# Patient Record
Sex: Female | Born: 1945 | Hispanic: Yes | Marital: Single
Health system: Midwestern US, Academic
[De-identification: ages and names within clinical notes are randomized; demographics above are authoritative.]

---

## 2018-07-31 ENCOUNTER — Emergency Department: Admit: 2018-07-31 | Discharge: 2018-07-31

## 2018-07-31 ENCOUNTER — Encounter: Admit: 2018-07-31 | Discharge: 2018-07-31

## 2018-07-31 ENCOUNTER — Emergency Department: Admit: 2018-07-31 | Discharge: 2018-07-31 | Disposition: A | Discharge status: Home / Self Care | Payer: MEDICARE

## 2018-07-31 DIAGNOSIS — R8271 Bacteriuria: Principal | ICD-10-CM

## 2018-07-31 DIAGNOSIS — E785 Hyperlipidemia, unspecified: Principal | ICD-10-CM

## 2018-07-31 DIAGNOSIS — I10 Essential (primary) hypertension: ICD-10-CM

## 2018-07-31 LAB — URINALYSIS DIPSTICK
Lab: NEGATIVE % (ref 0–2)
Lab: NEGATIVE % (ref 0–5)
Lab: NEGATIVE % (ref 4–12)
Lab: NEGATIVE % — ABNORMAL LOW (ref 11–15)
Lab: NEGATIVE % — ABNORMAL LOW (ref 41–77)
Lab: NEGATIVE 10*3/uL (ref 150–400)
Lab: NEGATIVE FL — ABNORMAL LOW (ref 7–11)

## 2018-07-31 LAB — CBC AND DIFF
Lab: 0.1 10*3/uL (ref 0–0.20)
Lab: 0.1 10*3/uL (ref 0–0.45)
Lab: 0.6 10*3/uL (ref 0–0.80)
Lab: 2.3 10*3/uL (ref 1.0–4.8)
Lab: 3.6 10*3/uL (ref 1.8–7.0)
Lab: 6.8 K/UL — ABNORMAL LOW (ref 4.5–11.0)

## 2018-07-31 LAB — URINALYSIS, MICROSCOPIC

## 2018-07-31 LAB — C REACTIVE PROTEIN (CRP): Lab: 0.1 mg/dL — ABNORMAL HIGH (ref <1.0)

## 2018-07-31 LAB — COMPREHENSIVE METABOLIC PANEL
Lab: 138 MMOL/L — ABNORMAL LOW (ref 137–147)
Lab: 3.9 MMOL/L — ABNORMAL HIGH (ref 3.5–5.1)

## 2018-07-31 MED ORDER — FENTANYL CITRATE (PF) 50 MCG/ML IJ SOLN
50 ug | Freq: Once | INTRAVENOUS | 0 refills | Status: CP
Start: 2018-07-31 — End: ?
  Administered 2018-07-31: 18:00:00 50 ug via INTRAVENOUS

## 2018-07-31 MED ORDER — ONDANSETRON HCL (PF) 4 MG/2 ML IJ SOLN
4 mg | Freq: Once | INTRAVENOUS | 0 refills | Status: CP
Start: 2018-07-31 — End: ?
  Administered 2018-07-31: 18:00:00 4 mg via INTRAVENOUS

## 2018-07-31 MED ORDER — CEPHALEXIN 500 MG PO CAP
500 mg | ORAL_CAPSULE | Freq: Two times a day (BID) | ORAL | 0 refills | Status: AC
Start: 2018-07-31 — End: ?

## 2018-07-31 MED ORDER — IOHEXOL 350 MG IODINE/ML IV SOLN
75 mL | Freq: Once | INTRAVENOUS | 0 refills | Status: CP
Start: 2018-07-31 — End: ?
  Administered 2018-07-31: 20:00:00 75 mL via INTRAVENOUS

## 2018-07-31 MED ORDER — SODIUM CHLORIDE 0.9 % IJ SOLN
50 mL | Freq: Once | INTRAVENOUS | 0 refills | Status: CP
Start: 2018-07-31 — End: ?

## 2018-07-31 MED ORDER — PHENAZOPYRIDINE 100 MG PO TAB
100 mg | ORAL_TABLET | Freq: Three times a day (TID) | ORAL | 0 refills | Status: AC | PRN
Start: 2018-07-31 — End: ?

## 2018-08-02 LAB — CULTURE-URINE W/SENSITIVITY: Lab: <10

## 2020-03-23 ENCOUNTER — Ambulatory Visit: Admit: 2020-03-23 | Discharge: 2020-03-24 | Payer: MEDICARE

## 2020-03-23 ENCOUNTER — Encounter: Admit: 2020-03-23 | Discharge: 2020-03-23 | Payer: MEDICARE

## 2020-03-23 NOTE — Progress Notes
Date of Service: 03/23/2020    Subjective:             Sandy Castro is a 74 y.o. female with a history of HTN, HLD, GERD who presents for evaluation of fecal incontinence and abdominal pain.    History of Present Illness    The patient is Spanish speaker. She is accompanied by her daughter who acts as interpreter per patient's request. She reports having symptoms of fecal incontinence. Normally, she has a bowel movement every 2 days. Prior to having a bowel movement, she will often have generalized cramping abdominal pain. She will have fecal urgency and often is not able to make it to the restroom in time. She will then have incontinence of loose stool. She feels that she is passing stool, no numbness. She reports when the stool is more formed, she is usually able to make it to the restroom on time. She denies any blood in the stool. She reports at times she will have constipation with straining. Has used Miralax and stool softeners on occasion but does not use any thing regularly. She denies tenesmus or nocturnal stools. No difficulties with urination or urinary incontinence. She reports prior hysterectomy, no other abdominal surgeries. She has 5 kids, all vaginal deliveries.        Review of Systems   Constitutional: Positive for fatigue.   HENT: Negative.    Eyes: Negative.    Respiratory: Positive for apnea.    Cardiovascular: Positive for leg swelling.   Gastrointestinal: Positive for constipation.   Endocrine: Negative.    Genitourinary: Positive for enuresis and pelvic pain.   Musculoskeletal: Positive for arthralgias, back pain, myalgias, neck pain and neck stiffness.   Skin: Negative.    Allergic/Immunologic: Negative.    Neurological: Negative.    Hematological: Negative.    Psychiatric/Behavioral: Negative.      Medical History:   Diagnosis Date   ? Hyperlipidemia    ? Hypertension      Surgical History:   Procedure Laterality Date   ? HX HYSTERECTOMY       History reviewed. No pertinent family history. Social History     Socioeconomic History   ? Marital status: Married     Spouse name: Not on file   ? Number of children: Not on file   ? Years of education: Not on file   ? Highest education level: Not on file   Occupational History   ? Not on file   Tobacco Use   ? Smoking status: Never Smoker   ? Smokeless tobacco: Never Used   Substance and Sexual Activity   ? Alcohol use: No   ? Drug use: No   ? Sexual activity: Not on file   Other Topics Concern   ? Not on file   Social History Narrative   ? Not on file         Objective:         ? AMLODIPINE BESYLATE (NORVASC PO) Take 5 mg by mouth.   ? CARVEDILOL PO Take 25 mg by mouth.   ? chlorthalidone (HYGROTON) 50 mg tablet Take 50 mg by mouth daily.   ? ESTRADIOL (ESTRACE PO) Take  by mouth.   ? hydrALAZINE (APRESOLINE) 100 mg tablet    ? HYDROCHLOROTHIAZIDE PO Take  by mouth.   ? hydrOXYzine (VISTARIL) 25 mg capsule Take 1 Cap by mouth every 6 hours as needed for Itching.   ? LISINOPRIL PO Take  by mouth.   ?  losartan (COZAAR) 100 mg tablet Take 100 mg by mouth daily.   ? naproxen (NAPROSYN) 500 mg tablet    ? OMEPRAZOLE PO Take  by mouth.   ? pantoprazole DR (PROTONIX) 40 mg tablet Take 40 mg by mouth daily.   ? phenazopyridine (PYRIDIUM) 100 mg tablet Take one tablet by mouth three times daily as needed for Pain. Take after meals for up to 2 days.   ? SIMVASTATIN PO Take  by mouth.   ? spironolactone (ALDACTONE) 25 mg tablet Take 25 mg by mouth daily.   ? traZODone (DESYREL) 100 mg tablet      Vitals:    03/23/20 1352   BP: 129/44   BP Source: Arm, Left Upper   Patient Position: Sitting   Pulse: 57   Temp: 36.8 ?C (98.2 ?F)   TempSrc: Oral   Weight: 81.6 kg (180 lb)   Height: 162.6 cm (64)   PainSc: Zero     Body mass index is 30.9 kg/m?[facility]     Physical Exam  Vitals signs reviewed.   Constitutional:       Appearance: She is obese.   HENT:      Mouth/Throat:      Mouth: Mucous membranes are moist.      Pharynx: Oropharynx is clear.   Eyes:      General: No scleral icterus.     Extraocular Movements: Extraocular movements intact.      Pupils: Pupils are equal, round, and reactive to light.   Cardiovascular:      Rate and Rhythm: Normal rate and regular rhythm.      Pulses: Normal pulses.   Pulmonary:      Effort: Pulmonary effort is normal.   Abdominal:      General: Abdomen is flat. There is no distension.      Palpations: Abdomen is soft. There is no mass.      Tenderness: There is no abdominal tenderness. There is no guarding.   Genitourinary:     Comments: Rectal exam: Perianal sensation intact. No rectal tenderness or mass. Rectal tone seems mildly reduced but squeeze present   Neurological:      General: No focal deficit present.      Mental Status: She is alert and oriented to person, place, and time.              Assessment and Plan:  Sandy Castro is a 74 y.o. female with a history of HTN, HLD, GERD who presents for evaluation of fecal incontinence and abdominal pain.      #Fecal Incontinence  #Alternating diarrhea and constipation  #Abdominal pain  -Constipation alternating with diarrhea. 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 Per outside records, this was performed at [facility]  -Recommend starting fiber supplement daily to bulk stools to reduce diarrhea/incontinence and promote regularity  -Anorectal manometry to evaluate sphincter tone, sensation and propulsive force.  -We will obtain her outside colonoscopy records for review.    RTC 11M    Discussed with Dr. Dolores Hoose, MD  GI Fellow  Pager [phone]

## 2020-03-24 ENCOUNTER — Ambulatory Visit: Admit: 2020-03-24 | Discharge: 2020-03-24 | Payer: MEDICARE

## 2020-03-24 ENCOUNTER — Encounter: Admit: 2020-03-24 | Discharge: 2020-03-24 | Payer: MEDICARE

## 2020-03-24 DIAGNOSIS — R159 Full incontinence of feces: Secondary | ICD-10-CM

## 2020-03-27 ENCOUNTER — Encounter: Admit: 2020-03-27 | Discharge: 2020-03-27 | Payer: MEDICARE

## 2020-03-27 DIAGNOSIS — E785 Hyperlipidemia, unspecified: Secondary | ICD-10-CM

## 2020-03-27 DIAGNOSIS — I10 Essential (primary) hypertension: Secondary | ICD-10-CM

## 2020-05-15 ENCOUNTER — Encounter: Admit: 2020-05-15 | Discharge: 2020-05-15 | Payer: MEDICARE

## 2020-05-15 NOTE — Telephone Encounter
Drenda Freeze requesting to est care for pt. d/t painful, hard breast. States 6 months ago the mammogram was clear. Please call [phone]. States pt. will need a spanish interpreter. LVM [id number]

## 2020-05-15 NOTE — Telephone Encounter
Patient has been scheduled

## 2020-08-30 ENCOUNTER — Emergency Department: Admit: 2020-08-30 | Discharge: 2020-08-30 | Payer: MEDICARE

## 2020-08-30 ENCOUNTER — Encounter: Admit: 2020-08-30 | Discharge: 2020-08-30 | Payer: MEDICARE

## 2020-08-30 DIAGNOSIS — I10 Essential (primary) hypertension: Secondary | ICD-10-CM

## 2020-08-30 DIAGNOSIS — E785 Hyperlipidemia, unspecified: Secondary | ICD-10-CM

## 2020-08-30 DIAGNOSIS — R059 Cough: Secondary | ICD-10-CM

## 2020-08-30 DIAGNOSIS — R079 Chest pain, unspecified: Secondary | ICD-10-CM

## 2020-08-30 LAB — COMPREHENSIVE METABOLIC PANEL
Lab: 0.4 mg/dL (ref 0.3–1.2)
Lab: 0.9 mg/dL (ref 0.4–1.00)
Lab: 10 K/UL (ref 3–12)
Lab: 102 MMOL/L (ref 98–110)
Lab: 126 mg/dL — ABNORMAL HIGH (ref 70–100)
Lab: 137 MMOL/L (ref 137–147)
Lab: 14 U/L (ref 7–56)
Lab: 16 U/L (ref 7–40)
Lab: 23 mg/dL (ref 7–25)
Lab: 25 MMOL/L (ref 21–30)
Lab: 3.5 MMOL/L (ref 3.5–5.1)
Lab: 37 U/L — ABNORMAL LOW (ref 25–110)
Lab: 4.1 g/dL (ref 3.5–5.0)
Lab: 7.3 g/dL (ref 6.0–8.0)
Lab: 9.1 mg/dL (ref 8.5–10.6)
Lab: >60 mL/min (ref >60)
Lab: >60 mL/min (ref >60)

## 2020-08-30 LAB — CBC AND DIFF
Lab: 0 10*3/uL (ref 0–0.20)
Lab: 0 10*3/uL (ref 0–0.45)
Lab: 6.6 10*3/uL (ref 4.5–11.0)
Lab: 15 (ref <20.7)

## 2020-08-30 LAB — POC TROPONIN
Lab: 0 ng/mL (ref 0.00–0.05)
Lab: 0 ng/mL (ref 0.00–0.05)

## 2020-08-30 LAB — BNP POC ER: Lab: 62 pg/mL (ref 0–100)

## 2020-08-30 LAB — COVID-19 (SARS-COV-2) PCR

## 2020-08-30 MED ORDER — AMOXICILLIN-POT CLAVULANATE 875-125 MG PO TAB
1 | ORAL_TABLET | Freq: Two times a day (BID) | ORAL | 0 refills | 7.00000 days | Status: DC
Start: 2020-08-30 — End: 2020-08-30

## 2020-08-30 MED ORDER — DOXYCYCLINE HYCLATE 100 MG PO TAB
100 mg | ORAL_TABLET | Freq: Two times a day (BID) | ORAL | 0 refills | 8.00000 days | Status: DC
Start: 2020-08-30 — End: 2020-08-30

## 2020-08-30 MED ORDER — DOXYCYCLINE HYCLATE 100 MG PO TAB
100 mg | Freq: Two times a day (BID) | ORAL | 0 refills | Status: DC
Start: 2020-08-30 — End: 2020-08-30

## 2020-08-30 MED ORDER — DOXYCYCLINE HYCLATE 100 MG PO TAB
100 mg | Freq: Once | ORAL | 0 refills | Status: CP
Start: 2020-08-30 — End: ?
  Administered 2020-08-30: 18:00:00 100 mg via ORAL

## 2020-08-30 MED ORDER — AMLODIPINE 5 MG PO TAB
5 mg | Freq: Once | ORAL | 0 refills | Status: CP
Start: 2020-08-30 — End: ?
  Administered 2020-08-30: 15:00:00 5 mg via ORAL

## 2020-08-30 MED ORDER — LACTATED RINGERS IV SOLP
500 mL | Freq: Once | INTRAVENOUS | 0 refills | Status: CP
Start: 2020-08-30 — End: ?
  Administered 2020-08-30: 16:00:00 500 mL via INTRAVENOUS

## 2020-08-30 MED ORDER — ACETAMINOPHEN 325 MG PO TAB
650 mg | Freq: Once | ORAL | 0 refills | Status: CP
Start: 2020-08-30 — End: ?
  Administered 2020-08-30: 15:00:00 650 mg via ORAL

## 2020-08-30 MED ORDER — DOXYCYCLINE HYCLATE 100 MG PO TAB
100 mg | ORAL_TABLET | Freq: Two times a day (BID) | ORAL | 0 refills | 8.00000 days | Status: AC
Start: 2020-08-30 — End: ?

## 2020-08-30 MED ORDER — HYDROCODONE-ACETAMINOPHEN 5-325 MG PO TAB
1 | Freq: Once | ORAL | 0 refills | Status: CP
Start: 2020-08-30 — End: ?
  Administered 2020-08-30: 17:00:00 1 via ORAL

## 2020-08-30 MED ORDER — AMOXICILLIN-POT CLAVULANATE 875-125 MG PO TAB
875 mg | Freq: Two times a day (BID) | ORAL | 0 refills | Status: DC
Start: 2020-08-30 — End: 2020-08-30

## 2020-08-30 NOTE — ED Notes
Discharge instructions and follow up instructions discussed with patient. Pt verbalizes understanding; pt denies any questions or concerns at this time. PIV removed. Pt ambulated with steady gait out of department.

## 2020-08-30 NOTE — ED Notes
Sandy Castro is a 74 y/o female presenting to ED41 via POV with CC of CP and HA. Pt reports tightness in her chest, generalized fatigue, SOA on exertion, and tension headache since yesterday. Pt reports taking Aleeve for symptoms but yesterday but had no relief. Pt denies known contact with ill persons. Pt has hx of HLD and HTN. Pt is alert and oriented; VSS; breathing is even and non labored on RA; skin warm/dry/intact. Daughter at bedside. EKG obtained in triage; POC trop & BNP running at bedside; pt remains on cardiac monitors.       Personal belongings: 1 pant, 1 shirt, 1 jacket, 2 shoes, 1 eye glasses, 1 cloth mask

## 2020-08-30 NOTE — ED Provider Notes
Sandy Castro is a 74 y.o. female.    Chief Complaint:  Chief Complaint   Patient presents with   ? Chest Pain     left chest pain radiating to left head and shoulders started last night after going for a walk        History of Present Illness:  74 year old female with history of hypertension, and hypercholesterolemia, presenting complaining of bilateral frontal headache which started last night, no acute onset, with gradual worsening.  Denies change in vision, or slurred speech.  No lightheadedness, or syncope.  No localized weakness or numbness.  Also complains of mild left-sided intermittent chest pain, non exertional, not associated with diaphoresis, or shortness of breath.  Denies lightheadedness, or syncope.  No recent trauma.  No change in urination or bowel movement.          Review of Systems:  Review of Systems   Constitutional: Negative for activity change, chills and fever.   HENT: Negative for congestion, rhinorrhea and sore throat.    Eyes: Negative for visual disturbance.   Respiratory: Negative for apnea, cough, chest tightness and shortness of breath.    Cardiovascular: Positive for chest pain.   Gastrointestinal: Negative for abdominal distention, abdominal pain and vomiting.   Genitourinary: Negative for difficulty urinating, dysuria and flank pain.   Musculoskeletal: Negative for back pain and neck pain.   Skin: Negative for color change and rash.   Neurological: Positive for headaches. Negative for dizziness.   Psychiatric/Behavioral: Negative for agitation, behavioral problems and confusion.       Allergies:  Patient has no known allergies.    Past Medical History:  Medical History:   Diagnosis Date   ? Hyperlipidemia    ? Hypertension        Past Surgical History:  Surgical History:   Procedure Laterality Date   ? ANORECTAL MANOMETRY N/A 03/27/2020    Performed by Eliott Nine, MD at [facility] ENDO   ? HX HYSTERECTOMY           Social History:  Social History     Tobacco Use   ? Smoking status: Never Smoker   ? Smokeless tobacco: Never Used   Substance Use Topics   ? Alcohol use: No   ? Drug use: No     Social History     Substance and Sexual Activity   Drug Use No             Family History:  No family history on file.    Vitals:  ED Vitals    Date and Time T BP P RR SPO2P SPO2 User   08/30/20 1230 -- 141/57 66 18 PER MINUTE 65 96 % ST   08/30/20 1200 -- 130/64 65 14 PER MINUTE 64 96 % ST   08/30/20 1130 -- 144/59 62 14 PER MINUTE 62 98 % ST   08/30/20 1030 -- 140/59 67 12 PER MINUTE 66 97 % ST   08/30/20 0956 37 ?C (98.6 ?F) 155/63 -- 16 PER MINUTE 74 96 % [place]          Physical Exam:  Physical Exam  Vitals and nursing note reviewed.   Constitutional:       Appearance: Normal appearance. She is well-developed and normal weight.   HENT:      Head: Normocephalic and atraumatic.      Nose: Nose normal.      Mouth/Throat:      Mouth: Mucous membranes are moist.   Eyes:  Extraocular Movements: Extraocular movements intact.      Conjunctiva/sclera: Conjunctivae normal.   Cardiovascular:      Rate and Rhythm: Normal rate and regular rhythm.      Heart sounds: Normal heart sounds.   Pulmonary:      Effort: Pulmonary effort is normal. No respiratory distress.      Breath sounds: Normal breath sounds.   Abdominal:      General: Bowel sounds are normal. There is no distension.      Palpations: Abdomen is soft.      Tenderness: There is no abdominal tenderness. There is no guarding or rebound.   Musculoskeletal:         General: Normal range of motion.      Cervical back: Neck supple.   Skin:     General: Skin is warm.   Neurological:      Mental Status: She is alert and oriented to person, place, and time. Mental status is at baseline.      Comments: Cranial nerves II through XII are intact and symmetric.  Upper and lower extremity motor strength is 5 out of 5 and symmetric.  Sensation soft touch in the upper and lower extremities are intact symmetric.  Cerebellar exam in the upper and lower extremities are intact and symmetric.   Psychiatric:         Mood and Affect: Mood normal.         Behavior: Behavior normal.         Laboratory Results:  Labs Reviewed   CBC AND DIFF - Abnormal       Result Value Ref Range Status    White Blood Cells 6.6  4.5 - 11.0 K/UL Final    RBC 4.06  4.0 - 5.0 M/UL Final    Hemoglobin 12.7  12.0 - 15.0 GM/DL Final    Hematocrit 16.1  36 - 45 % Final    MCV 90.4  80 - 100 FL Final    MCH 31.2  26 - 34 PG Final    MCHC 34.6  32.0 - 36.0 G/DL Final    RDW 09.6  11 - 15 % Final    Platelet Count 216  150 - 400 K/UL Final    MPV 7.5  7 - 11 FL Final    Neutrophils 67  41 - 77 % Final    Lymphocytes 21 (*) 24 - 44 % Final    Monocytes 10  4 - 12 % Final    Eosinophils 1  0 - 5 % Final    Basophils 1  0 - 2 % Final    Absolute Neutrophil Count 4.44  1.8 - 7.0 K/UL Final    Absolute Lymph Count 1.38  1.0 - 4.8 K/UL Final    Absolute Monocyte Count 0.65  0 - 0.80 K/UL Final    Absolute Eosinophil Count 0.07  0 - 0.45 K/UL Final    Absolute Basophil Count 0.04  0 - 0.20 K/UL Final    MDW (Monocyte Distribution Width) 15.8  <20.7 Final   COMPREHENSIVE METABOLIC PANEL - Abnormal    Sodium 137  137 - 147 MMOL/L Final    Potassium 3.5  3.5 - 5.1 MMOL/L Final    Chloride 102  98 - 110 MMOL/L Final    Glucose 126 (*) 70 - 100 MG/DL Final    Blood Urea Nitrogen 23  7 - 25 MG/DL Final    Creatinine 0.45  0.4 - 1.00 MG/DL  Final    Calcium 9.1  8.5 - 10.6 MG/DL Final    Total Protein 7.3  6.0 - 8.0 G/DL Final    Total Bilirubin 0.4  0.3 - 1.2 MG/DL Final    Albumin 4.1  3.5 - 5.0 G/DL Final    Alk Phosphatase 37  25 - 110 U/L Final    AST (SGOT) 16  7 - 40 U/L Final    CO2 25  21 - 30 MMOL/L Final    ALT (SGPT) 14  7 - 56 U/L Final    Anion Gap 10  3 - 12 Final    eGFR Non African American >60  >60 mL/min Final    eGFR African American >60  >60 mL/min Final   COVID-19 (SARS-COV-2) PCR   BNP POC ER    BNP POC 62.0  0 - 100 PG/ML Final   POC TROPONIN    Troponin-I-POC 0.00  0.00 - 0.05 NG/ML Final   POC TROPONIN Troponin-I-POC 0.01  0.00 - 0.05 NG/ML Final   POC TROPONIN   POC BNP          Radiology Interpretation:    CT HEAD WO CONTRAST   Final Result      1. No acute intracranial hemorrhage or mass effect.   2. Mild chronic small vessel ischemic changes.   3. Stable small cortical left frontal and left occipital calcifications which may be due to old inflammatory or infectious etiology.          Finalized by Marily Memos, M.D. on 08/30/2020 11:35 AM. Dictated by Marily Memos, M.D. on 08/30/2020 11:28 AM.         CHEST SINGLE VIEW   Final Result         1.  Mild cardiomegaly and pulmonary venous congestion.      2.  Mild bibasilar atelectasis.      By my electronic signature, I attest that I have personally reviewed the images for this examination and formulated the interpretations and opinions expressed in this report          Finalized by Arlana Hove, M.D. on 08/30/2020 12:57 PM. Dictated by Rene Paci, M.D. on 08/30/2020 11:17 AM.                  EKG:    -Normal sinus rhythm rate at 73, no obvious ST or T wave abnormality.  QTC at 457, QRS of 89.    ED Course:    -Patient is currently stable, vital signs are within normal.  On repeat questioning patient reports has had mild cough.  According to patient and family member, history of COVID-19 infection in April, patient has been vaccinated post Covid illness.  Chest x-ray demonstrates some increased opacities in the lower lobes, in light of patient's cough, and symptoms of chest pain, will treat for atypical infection.  Patient has been given a dose of doxycycline in the department, will write prescription for doxycycline.  Initial, and repeat troponin are negative.  Currently headache has resolved.  Neurological exam is intact and symmetric.  CT head is negative.  I have spoken with patient, and family member at length, advised them to follow-up with primary care physician within 2 to 3 days, given extensive verbal discharge instructions and return precautions.  They verbalized full understanding, comfortable with plan.  Patient has been discharged.         ED Scoring:  MDM  Reviewed: nursing note and vitals  Interpretation: labs, ECG and x-ray        Facility Administered Meds:  Medications   doxycycline hyclate (VIBRACIN) tablet 100 mg (has no administration in time range)   lactated ringers infusion (0 mL Intravenous Infusion Stopped 08/30/20 1100)   acetaminophen (TYLENOL) tablet 650 mg (650 mg Oral Given 08/30/20 1019)   amLODIPine (NORVASC) tablet 5 mg (5 mg Oral Given 08/30/20 1019)   HYDROcodone/acetaminophen (NORCO) 5/325 mg tablet 1 tablet (1 tablet Oral Given 08/30/20 1214)         Clinical Impression:  Clinical Impression   Cough   Chest pain, unspecified type       Disposition/Follow up  ED Disposition     ED Disposition    Discharge        Willette Alma, MD  [address]  [address] [place] [address]  [phone]    Schedule an appointment as soon as possible for a visit in 3 days        Medications:  Current Discharge Medication List      START taking these medications    Details   doxycycline hyclate (VIBRACIN) 100 mg tablet Take one tablet by mouth twice daily for 7 days.  Qty: 14 tablet, Refills: 0             Procedure Notes:  Procedures        Attestation / Supervision:  I personally performed the E/M including history, physical exam, and MDM.      Nicki Guadalajara, MD

## 2020-08-31 ENCOUNTER — Encounter: Admit: 2020-08-31 | Discharge: 2020-08-31 | Payer: MEDICARE

## 2020-08-31 NOTE — Telephone Encounter
Spoke with patient via spanish interpreter [phone] and confirmed name and DOB. Patient advised that COVID-19 test results are NEGATIVE. Advised that patient can continue with the procedure and should follow pre-procedure instructions. Advised patient to continue with home quarantine until procedure. Advised that if they develop any concerning symptoms prior to the procedure to contact their procedure team, specialist, and/or PCP for assistance.

## 2020-09-11 ENCOUNTER — Encounter: Admit: 2020-09-11 | Discharge: 2020-09-11 | Payer: MEDICARE

## 2021-07-20 ENCOUNTER — Encounter: Admit: 2021-07-20 | Discharge: 2021-07-20 | Payer: MEDICARE

## 2021-07-27 ENCOUNTER — Encounter: Admit: 2021-07-27 | Discharge: 2021-07-27 | Payer: MEDICARE

## 2021-08-01 ENCOUNTER — Encounter: Admit: 2021-08-01 | Discharge: 2021-08-01 | Payer: MEDICARE

## 2021-08-01 DIAGNOSIS — R928 Other abnormal and inconclusive findings on diagnostic imaging of breast: Secondary | ICD-10-CM

## 2021-08-01 NOTE — Progress Notes
Left a message to return my call re: appointment for next week using spanish interpreter ID # [phone], Jari Favre.

## 2021-08-01 NOTE — Telephone Encounter
Navigation Intake Assessment    Patient Name:  Sandy Castro  DOB: 02/27/1946  Insurance:  [employer]   Direct Referral: None  Appointment Info:    Future Appointments   Date Time Provider Department Center   08/09/2021 10:00 AM Lazarus Salines, Dema Severin, PA-C [id number] [place] Exam   08/09/2021 11:30 AM [facility] - [facility] BREAST ROOM 2 [id number] Radiolog   08/09/2021  1:00 PM [facility] - [facility] BREAST ROOM 1 [id number] Radiolog     Diagnosis & Reason for Visit:  Biopsy workup       Physician Info:    Referring Physician:  Dr. Court Joy    Location of Films:   Image Viewer (Search by Name and DOB, not MRN): Images requested on 9/8    Location of Pathology:  None     History of Present Illness: Sandy Castro, age 75, was referred to [facility] for a pre-biopsy workup for a left breast lump discovered on clinical exam by Dr. Court Joy. She had a bilateral diagnostic mammogram and left breast ultrasound on 07/17/21 at [facility] that noted a left breast mass. Patient denies breast pain, skin changes, or nipple discharge. Patient denies a history of breast issues, surgeries, or biopsies. Biopsy work-up protocol has been implemented and patient has been appropriately scheduled for diagnostic imaging and provider visit.      Allergies reviewed and verified with the patient, and documented in Epic:  Yes    Family History of Breast Cancer:   Breast Cancer: None  Ovarian Cancer: None   Prostate Cancer: None     Personal History of Other Cancers: None

## 2021-08-07 ENCOUNTER — Encounter: Admit: 2021-08-07 | Discharge: 2021-08-07 | Payer: MEDICARE

## 2021-08-09 ENCOUNTER — Encounter: Admit: 2021-08-09 | Discharge: 2021-08-09 | Payer: MEDICARE

## 2021-08-09 ENCOUNTER — Ambulatory Visit: Admit: 2021-08-09 | Discharge: 2021-08-09 | Payer: MEDICARE

## 2021-08-09 DIAGNOSIS — I10 Essential (primary) hypertension: Secondary | ICD-10-CM

## 2021-08-09 DIAGNOSIS — N6321 Unspecified lump in the left breast, upper outer quadrant: Secondary | ICD-10-CM

## 2021-08-09 DIAGNOSIS — E785 Hyperlipidemia, unspecified: Secondary | ICD-10-CM

## 2021-08-09 DIAGNOSIS — R928 Other abnormal and inconclusive findings on diagnostic imaging of breast: Secondary | ICD-10-CM

## 2021-12-25 ENCOUNTER — Encounter: Admit: 2021-12-25 | Discharge: 2021-12-25 | Payer: MEDICARE

## 2022-01-07 ENCOUNTER — Encounter: Admit: 2022-01-07 | Discharge: 2022-01-07 | Payer: MEDICARE

## 2022-08-29 ENCOUNTER — Encounter: Admit: 2022-08-29 | Discharge: 2022-08-29 | Payer: MEDICARE

## 2022-08-29 DIAGNOSIS — Z1231 Encounter for screening mammogram for malignant neoplasm of breast: Secondary | ICD-10-CM

## 2022-08-29 DIAGNOSIS — Z13820 Encounter for screening for osteoporosis: Secondary | ICD-10-CM

## 2022-09-07 ENCOUNTER — Encounter: Admit: 2022-09-07 | Discharge: 2022-09-07 | Payer: MEDICARE

## 2023-08-05 ENCOUNTER — Encounter: Admit: 2023-08-05 | Discharge: 2023-08-05 | Payer: MEDICARE

## 2023-08-14 ENCOUNTER — Encounter: Admit: 2023-08-14 | Discharge: 2023-08-14 | Payer: MEDICARE | Primary: Medical

## 2023-08-14 ENCOUNTER — Emergency Department: Admit: 2023-08-14 | Discharge: 2023-08-14 | Payer: MEDICARE

## 2023-08-14 DIAGNOSIS — W540XXA Bitten by dog, initial encounter: Secondary | ICD-10-CM

## 2023-08-14 DIAGNOSIS — Z23 Encounter for immunization: Secondary | ICD-10-CM

## 2023-08-14 MED ORDER — SODIUM CHLORIDE 0.9 % IR SOLN
Freq: Once | 0 refills | Status: CP
Start: 2023-08-14 — End: ?
  Administered 2023-08-14: 17:00:00 50 mL

## 2023-08-14 MED ORDER — DIPHTH,PERTUS(ACELL),TETANUS 2.5-8-5 LF-MCG-LF/0.5ML IM SYRG
.5 mL | Freq: Once | INTRAMUSCULAR | 0 refills | Status: CP
Start: 2023-08-14 — End: ?
  Administered 2023-08-14: 16:00:00 0.5 mL via INTRAMUSCULAR

## 2023-08-14 MED ORDER — AMOXICILLIN-POT CLAVULANATE 875-125 MG PO TAB
1 | ORAL_TABLET | Freq: Two times a day (BID) | ORAL | 0 refills | 7.00000 days | Status: AC
Start: 2023-08-14 — End: ?

## 2023-08-14 MED ORDER — LIDOCAINE HCL 10 MG/ML (1 %) IJ SOLN
20 mL | Freq: Once | INTRAMUSCULAR | 0 refills | Status: CP
Start: 2023-08-14 — End: ?
  Administered 2023-08-14: 17:00:00 20 mL via INTRAMUSCULAR

## 2023-08-14 MED ORDER — BACITRACIN ZINC 500 UNIT/GRAM TP OINT
Freq: Once | TOPICAL | 0 refills | Status: CP
Start: 2023-08-14 — End: ?
  Administered 2023-08-14: 17:00:00 via TOPICAL

## 2023-08-14 NOTE — ED Notes
Pt A&Ox4 and given discharge instructions and follow up information. Pt verbalizes understanding and has no further questions or complaints. VSS. Pt ambulated out of ED w/ steady gait to go home by POV.

## 2023-08-14 NOTE — ED Provider Notes
Sandy Castro is a 77 y.o. female.    Chief Complaint:  Chief Complaint   Patient presents with    Dog Bite     Pt walking home and dog bite her left calf. Neighbor states that dogs have had shots, but pt is unsure which ones. Unknown type of dog       History of Present Illness:  Sandy Castro is a 77 y.o. female, with a past medical history of HLD and HTN who presents to the emergency department for dog bite. Son at bedside assisting with history and interpreting. Son notes pt was bitten by a neighbor's dog while outside this AM. Bite is located on the posterior aspect of the left calf. Son states the neighbor reported the dog is vaccinated, however, no report with animal control was made. Pt endorses compliance with medications. Denies blood thinners. Unaware of last tetanus booster. Denies numbness, loss of ROM to the foot or ankle.       History provided by:  Patient and relative  Language interpreter used: Yes (son)        Review of Systems:  Review of Systems   Constitutional:  Negative for fever.   HENT:  Negative for sore throat.    Eyes:  Negative for visual disturbance.   Respiratory:  Negative for shortness of breath.    Cardiovascular:  Negative for chest pain.   Gastrointestinal:  Negative for abdominal pain.   Genitourinary:  Negative for dysuria.   Musculoskeletal:  Negative for back pain.   Skin:  Positive for wound. Negative for rash.   Neurological:  Negative for headaches.       Allergies:  Patient has no known allergies.    Past Medical History:  Past Medical History:   Diagnosis Date    Hyperlipidemia     Hypertension        Past Surgical History:  Surgical History:   Procedure Laterality Date    ANORECTAL MANOMETRY N/A 03/27/2020    Performed by Eliott Nine, MD at [facility]    HX HYSTERECTOMY         Pertinent medical/surgical history reviewed  Past Medical History:   Diagnosis Date    Hyperlipidemia     Hypertension      Surgical History:   Procedure Laterality Date    ANORECTAL MANOMETRY N/A 03/27/2020    Performed by Eliott Nine, MD at [facility]    HX HYSTERECTOMY         Social History:  Social History     Tobacco Use    Smoking status: Never    Smokeless tobacco: Never   Substance Use Topics    Alcohol use: No    Drug use: No     Social History     Substance and Sexual Activity   Drug Use No             Family History:  No family history on file.    Vitals:  ED Vitals      Date and Time T BP P RR SPO2P SPO2 User   08/14/23 1254 -- 118/68 -- -- -- -- BG   08/14/23 1253 -- -- 58 -- -- 93 % BG   08/14/23 1101 -- -- 51 -- -- -- BG   08/14/23 1100 -- 118/40 -- -- -- 95 % BG   08/14/23 1004 36.8 ?C (98.3 ?F) 133/67 57 16 PER MINUTE -- 96 % MM  Physical Exam:  Physical Exam  Vitals and nursing note reviewed.   Constitutional:       Appearance: Normal appearance. She is normal weight.   HENT:      Head: Normocephalic and atraumatic.      Right Ear: External ear normal.      Left Ear: External ear normal.   Eyes:      Extraocular Movements: Extraocular movements intact.      Conjunctiva/sclera: Conjunctivae normal.   Cardiovascular:      Rate and Rhythm: Normal rate and regular rhythm.      Pulses: Normal pulses.      Heart sounds: Normal heart sounds.   Pulmonary:      Effort: Pulmonary effort is normal.      Breath sounds: Normal breath sounds.   Musculoskeletal:         General: Normal range of motion.      Cervical back: Neck supple.      Left lower leg: Laceration present. No deformity, tenderness or bony tenderness.      Left ankle: Normal.      Left Achilles Tendon: Normal.      Left foot: Normal. Normal capillary refill. Normal pulse.   Skin:     General: Skin is warm and dry.      Capillary Refill: Capillary refill takes less than 2 seconds.      Findings: Laceration present.      Comments: 3.5x cm laceration to posterior left calf. Bleeding controlled with pressure    Neurological:      General: No focal deficit present.      Mental Status: She is alert and oriented to person, place, and time. Mental status is at baseline.      Motor: No weakness.   Psychiatric:         Mood and Affect: Mood normal.         Behavior: Behavior normal.         Laboratory Results:  Labs Reviewed - No data to display       Radiology Interpretation:  TIBIA & FIBULA 2 VIEWS LEFT   Final Result   IMPRESSION:      1.  Moderate soft tissue irregularity and soft tissue emphysema along the proximal posterior calf compatible with the history of a dog bite. No radiopaque foreign body in this region.      2.  There is a tiny radiopaque density along the posterior lateral aspect of the mid fibula. This may reflect dystrophic calcification or old foreign body. Acute foreign body is less likely given the distance from the apparent soft tissue injury.      3.  No acute fracture. Maintained alignment at the knee and ankle.                Finalized by Shanna Cisco, M.D. on 08/14/2023 11:26 AM. Dictated by Shanna Cisco, M.D. on 08/14/2023 11:24 AM.          EKG:      Medical Decision Making:  Sandy Castro is a 77 y.o. female who presents with chief complaint as listed above. Based on the history and presentation, the list of differential diagnoses considered included, but was not limited to, retained fb, need for tdap, laceration repair, tendon injury, tib/fib frx     ED Course    ED Course as of 08/14/23 1415   Thu Aug 14, 2023   1104 Patient neurovascularly intact to the left lower extremity, will administer Tdap,  obtain a plain film to rule out foreign body, given the size of the laceration, gaping, and subcutaneous tissue seen, will plan for loose primary closure. [BR]   1216 Patient's x-ray is negative for any obvious fracture, dislocation, no signs of foreign body near the laceration.  An incidental unrelated avulsion versus previous foreign body noted.  Patient tolerated local wound anesthesia, irrigation with Betadine and sterile saline, and loose approximation with 10 simple interrupted sutures with good hemostasis.  Dressing applied with bacitracin.  Will discharge in stable condition with an Ace wrap to help control ecchymosis, recommend elevation, gentle indirect icing, keep clean and dry with antibacterial soap and water, dressing changes with baci twice daily, avoidance of tension or contamination, and recommend over-the-counter Tylenol for pain.  Will discharge in stable condition with instructions to return in 2 weeks for suture removal or sooner for worsening of symptoms.  Will also discharge with a prescription of Augmentin to take twice daily for the next week to help prevent wound infection. Return precautions discussed at length with patient and all questions answered. Patient verbalizes understanding and is comfortable with the plan.    [BR]      ED Course User Index  [BR] Heith Haigler R, PA-C       Complexity of Problems Addressed  Patient's active diagnoses as well as contributing pre-existing medical problems include:  Clinical Impression   Dog bite, initial encounter   Need for Tdap vaccination     Evaluation performed for potential threat to life or bodily function during this visit given the initial differential diagnosis and clinical impression(s) as discussed previously in MDM/ED course.    Additional data reviewed:    History was obtained from an independent historian: Family  Prior non-ED notes reviewed: Not in addition to what is mentioned above  Independent interpretation of diagnostic tests was performed by me: Not in addition to what is mentioned above  Patient presentation/management was discussed with the following qualified health care professionals and/or other relevant professionals: Not in addition to what is mentioned above    Risk evaluation:    Diagnosis or treatment of patient condition impacted by social determinant of health: None  Tests Considered but not performed due to clinical scoring (if not mentioned in ED course, aside from what is implied by clinical scores listed):   Rationale regarding whether admission or escalation of care considered if not performed (if not mentioned in ED course, aside from what is implied by clinical scores listed):     ED Scoring:                                Facility Administered Meds:  Medications   diphtheria/tetanus/pertus(acell) booster (Tdap) (BOOSTRIX) inj syringe 0.5 mL (0.5 mL Intramuscular Given 08/14/23 1124)   sodium chloride irrigation 0.9% bottle (0 mL Irrigation Infusion Stopped 08/14/23 1302)   bacitracin zinc topical ointment ( Topical Given 08/14/23 1140)   lidocaine 1 % (10mg /mL) injection 20 mL (20 mL Injection Given 08/14/23 1139)       Clinical Impression:  Clinical Impression   Dog bite, initial encounter   Need for Tdap vaccination       Disposition/Follow up  ED Disposition     ED Disposition   Discharge           Emergency Department: [place], [place]   [address]  [address]  [place] [address]  [phone]  In 2 weeks  For suture removal      Medications:  Discharge Medication List as of 08/14/2023 12:47 PM        START taking these medications    Details   amoxicillin-potassium clavulanate (AUGMENTIN) 875/125 mg tablet Take one tablet by mouth every 12 hours for 7 days., Disp-14 tablet, R-0, Normal             Procedure Notes:  Laceration Repair    Date/Time: 08/14/2023 9:54 AM    Performed by: Rose Fillers, PA-C  Authorized by: Rose Fillers, PA-C  Consent: Verbal consent obtained.  Risks and benefits: risks, benefits and alternatives were discussed  Consent given by: patient  Patient understanding: patient states understanding of the procedure being performed  Patient consent: the patient's understanding of the procedure matches consent given  Procedure consent: procedure consent matches procedure scheduled  Relevant documents: relevant documents present and verified  Test results: test results available and properly labeled  Site marked: the operative site was marked  Imaging studies: imaging studies available  Required items: required blood products, implants, devices, and special equipment available  Patient identity confirmed: verbally with patient  Time out: Immediately prior to procedure a time out was called to verify the correct patient, procedure, equipment, support staff and site/side marked as required.  Body area: lower extremity  Location details: left lower leg  Laceration length: 5 cm  Foreign bodies: no foreign bodies  Tendon involvement: none  Nerve involvement: none  Vascular damage: no  Anesthesia: local infiltration    Anesthesia:  Local Anesthetic: lidocaine 1% without epinephrine  Anesthetic total: 8 mL    Sedation:  Patient sedated: no  Preparation: Patient was prepped and draped in the usual sterile fashion.  Irrigation solution: saline (1 L of sterile saline with Betadine)  Irrigation method: jet lavage  Amount of cleaning: standard  Skin closure: 4-0 nylon  Number of sutures: 10  Technique: simple  Approximation: loose  Approximation difficulty: simple  Dressing: antibiotic ointment and gauze roll  Patient tolerance: patient tolerated the procedure well with no immediate complications             Attestation / Supervision:  I, Ola Thabit, am scribing for and in the presence of [employer].      Ola Geologist, engineering / Supervision Note concerning Quetzally Callas: The service was provided by the APP alone with immediate availability of a physician in the ED. and I, [employer], [employer], personally performed the services described in this documentation as scribed in my presence and it is both accurate and complete.    Janyla Biscoe R Zabria Liss, PA-C      Note has been documented by [employer] on 08/14/2023

## 2023-08-18 ENCOUNTER — Encounter: Admit: 2023-08-18 | Discharge: 2023-08-18 | Payer: MEDICARE | Primary: Medical

## 2023-08-18 ENCOUNTER — Ambulatory Visit: Admit: 2023-08-18 | Discharge: 2023-08-18 | Payer: MEDICARE | Primary: Medical

## 2023-08-18 DIAGNOSIS — S81852D Open bite, left lower leg, subsequent encounter: Secondary | ICD-10-CM

## 2023-08-18 NOTE — Progress Notes
Date of Service: 08/18/2023    Sandy Castro is a 77 y.o. female.  DOB: February 02, 1946  MRN: [id number]     Subjective:             History of Present Illness  Chief Complaint   Patient presents with    Dog Bite     Last Thursday. Pt was seen at [facility]  and     Wound Check     Weeping Seeping clearish/yellow fluid. Red and pink drainage on gauze pad. Wound is warm to touch and pain is radiating to upper thigh.   Patient reports to clinic for evaluation of the left posterior calf wound that he sustained by dog bite.  She was seen in our emergency department and was sutured at that time.  Patient was also placed on Augmentin twice a day and she continues to take this.  She is concerned because there is some tenderness around the wound and small amount of drainage.  There is also edema or swelling in her ankle.       Review of Systems   Constitutional: Negative.    HENT: Negative.     Respiratory: Negative.     Cardiovascular: Negative.    Gastrointestinal: Negative.    Skin:  Positive for wound.        Posterior left calf sutured dog bite.         Objective:          AMLODIPINE BESYLATE (NORVASC PO) Take 5 mg by mouth.    amoxicillin-potassium clavulanate (AUGMENTIN) 875/125 mg tablet Take one tablet by mouth every 12 hours for 7 days.    CARVEDILOL PO Take 25 mg by mouth.    chlorthalidone (HYGROTON) 50 mg tablet Take one tablet by mouth daily.    ESTRADIOL (ESTRACE PO) Take  by mouth.    gabapentin (NEURONTIN) 300 mg capsule Take one capsule by mouth every 8 hours.    hydrALAZINE (APRESOLINE) 100 mg tablet     HYDROCHLOROTHIAZIDE PO Take  by mouth.    hydrOXYzine (VISTARIL) 25 mg capsule Take 1 Cap by mouth every 6 hours as needed for Itching.    LISINOPRIL PO Take  by mouth.    losartan (COZAAR) 100 mg tablet Take one tablet by mouth daily.    naproxen (NAPROSYN) 500 mg tablet     omega-3s/dha/epa/fish oil/D3 (VITAMIN-D + OMEGA-3 PO) Take 1 tablet by mouth daily.    OMEPRAZOLE PO Take  by mouth.    pantoprazole DR (PROTONIX) 40 mg tablet Take one tablet by mouth daily.    phenazopyridine (PYRIDIUM) 100 mg tablet Take one tablet by mouth three times daily as needed for Pain. Take after meals for up to 2 days.    SIMVASTATIN PO Take  by mouth.    spironolactone (ALDACTONE) 25 mg tablet Take one tablet by mouth daily.    traZODone (DESYREL) 100 mg tablet     vitamins, B complex tab Take one tablet by mouth daily.     Vitals:    08/18/23 1802   BP: (!) 144/78   BP Source: Arm, Left Upper   Pulse: 99   Temp: 37.3 ?C (99.1 ?F)   Resp: 16   SpO2: 96%   TempSrc: Oral   PainSc: Eight   Weight: 75.9 kg (167 lb 6.4 oz)   Height: 162.6 cm (5' 4)     Body mass index is 28.73 kg/m?Marland Kitchen     Physical Exam  Vitals reviewed.   Constitutional:  Appearance: Normal appearance.   HENT:      Head: Normocephalic and atraumatic.   Cardiovascular:      Rate and Rhythm: Normal rate and regular rhythm.      Pulses: Normal pulses.      Heart sounds: Normal heart sounds.   Pulmonary:      Effort: Pulmonary effort is normal.      Breath sounds: Normal breath sounds.   Skin:     General: Skin is warm and dry.      Comments: Left calf demonstrates a fairly well-approximated laceration with suturing in place.  There is a very small amount of drainage of the wound that was present on the bandage.  Palpation around the wound does not produce any drainage or purulent material.  There is some induration around the wound with some tenderness but there is no evidence of abscess.  There is a surrounding area of erythema but this is consistent with the injury and inflammatory reaction and does not seem to be consistent with an infection.  There is minimal edema around the ankle.   Neurological:      Mental Status: She is alert.   Psychiatric:         Mood and Affect: Mood normal.              Assessment and Plan:  Joury Allcorn was seen today for dog bite and wound check.    Diagnoses and all orders for this visit:    Dog bite of left lower leg, subsequent encounter    I do not believe any changes in her therapy are required at this time.  The family and patient are to monitor for any signs of infection developing in the wound and to return to clinic if this occurs.  Otherwise return to clinic for suture removal at planned date.

## 2023-09-02 ENCOUNTER — Encounter: Admit: 2023-09-02 | Discharge: 2023-09-02 | Payer: MEDICARE | Primary: Medical

## 2023-09-15 ENCOUNTER — Encounter: Admit: 2023-09-15 | Discharge: 2023-09-15 | Payer: MEDICARE | Primary: Medical

## 2023-10-06 ENCOUNTER — Encounter: Admit: 2023-10-06 | Discharge: 2023-10-06 | Payer: MEDICARE | Primary: Medical

## 2023-10-29 ENCOUNTER — Encounter: Admit: 2023-10-29 | Discharge: 2023-10-29 | Payer: MEDICARE | Primary: Medical
